# Patient Record
Sex: Male | Born: 1950
Health system: Southern US, Community
[De-identification: ages and names within clinical notes are randomized; demographics above are authoritative.]

---

## 2006-02-08 ENCOUNTER — Ambulatory Visit: Payer: Self-pay | Admitting: Cardiology

## 2006-04-26 ENCOUNTER — Encounter: Payer: Self-pay | Admitting: Cardiology

## 2006-05-05 ENCOUNTER — Encounter: Payer: Self-pay | Admitting: Cardiology

## 2006-06-03 ENCOUNTER — Encounter: Payer: Self-pay | Admitting: Cardiology

## 2006-07-04 ENCOUNTER — Encounter: Payer: Self-pay | Admitting: Cardiology

## 2006-08-03 ENCOUNTER — Encounter: Payer: Self-pay | Admitting: Cardiology

## 2006-11-01 ENCOUNTER — Inpatient Hospital Stay: Payer: Self-pay | Admitting: Internal Medicine

## 2007-01-24 ENCOUNTER — Ambulatory Visit: Payer: Self-pay | Admitting: Gastroenterology

## 2012-07-30 ENCOUNTER — Ambulatory Visit: Payer: Self-pay | Admitting: Unknown Physician Specialty

## 2012-08-21 ENCOUNTER — Ambulatory Visit: Payer: Self-pay | Admitting: Internal Medicine

## 2013-05-23 ENCOUNTER — Ambulatory Visit: Payer: Self-pay | Admitting: Specialist

## 2013-05-29 ENCOUNTER — Ambulatory Visit: Payer: Self-pay | Admitting: Specialist

## 2013-06-12 ENCOUNTER — Ambulatory Visit: Payer: Self-pay | Admitting: Specialist

## 2013-06-17 ENCOUNTER — Ambulatory Visit: Payer: Self-pay | Admitting: Cardiothoracic Surgery

## 2013-06-20 ENCOUNTER — Ambulatory Visit: Payer: Self-pay | Admitting: Cardiothoracic Surgery

## 2013-06-20 LAB — BASIC METABOLIC PANEL
Anion Gap: 2 — ABNORMAL LOW (ref 7–16)
BUN: 20 mg/dL — ABNORMAL HIGH (ref 7–18)
CHLORIDE: 103 mmol/L (ref 98–107)
CREATININE: 1.36 mg/dL — AB (ref 0.60–1.30)
Calcium, Total: 9.3 mg/dL (ref 8.5–10.1)
Co2: 33 mmol/L — ABNORMAL HIGH (ref 21–32)
EGFR (African American): >60
GFR CALC NON AF AMER: 55 — AB
Glucose: 63 mg/dL — ABNORMAL LOW (ref 65–99)
OSMOLALITY: 276 (ref 275–301)
POTASSIUM: 4 mmol/L (ref 3.5–5.1)
SODIUM: 138 mmol/L (ref 136–145)

## 2013-06-20 LAB — CBC WITH DIFFERENTIAL/PLATELET
Basophil #: 0 10*3/uL (ref 0.0–0.1)
Basophil %: 0.7 %
Eosinophil #: 0.2 10*3/uL (ref 0.0–0.7)
Eosinophil %: 3.4 %
HCT: 44 % (ref 40.0–52.0)
HGB: 14.3 g/dL (ref 13.0–18.0)
LYMPHS ABS: 1.1 10*3/uL (ref 1.0–3.6)
LYMPHS PCT: 19.1 %
MCH: 30.9 pg (ref 26.0–34.0)
MCHC: 32.4 g/dL (ref 32.0–36.0)
MCV: 96 fL (ref 80–100)
Monocyte #: 0.8 x10 3/mm (ref 0.2–1.0)
Monocyte %: 13.7 %
Neutrophil #: 3.7 10*3/uL (ref 1.4–6.5)
Neutrophil %: 63.1 %
PLATELETS: 152 10*3/uL (ref 150–440)
RBC: 4.61 10*6/uL (ref 4.40–5.90)
RDW: 16.9 % — ABNORMAL HIGH (ref 11.5–14.5)
WBC: 5.9 10*3/uL (ref 3.8–10.6)

## 2013-06-29 ENCOUNTER — Inpatient Hospital Stay: Payer: Self-pay | Admitting: Internal Medicine

## 2013-06-29 LAB — CBC
HCT: 48 % (ref 40.0–52.0)
HGB: 15.2 g/dL (ref 13.0–18.0)
MCH: 30.3 pg (ref 26.0–34.0)
MCHC: 31.7 g/dL — ABNORMAL LOW (ref 32.0–36.0)
MCV: 95 fL (ref 80–100)
Platelet: 248 10*3/uL (ref 150–440)
RBC: 5.03 10*6/uL (ref 4.40–5.90)
RDW: 17.3 % — AB (ref 11.5–14.5)
WBC: 11.7 10*3/uL — ABNORMAL HIGH (ref 3.8–10.6)

## 2013-06-29 LAB — TROPONIN I: Troponin-I: >40 ng/mL

## 2013-06-29 LAB — COMPREHENSIVE METABOLIC PANEL
ALBUMIN: 2.9 g/dL — AB (ref 3.4–5.0)
ALT: 849 U/L — AB (ref 12–78)
Alkaline Phosphatase: 173 U/L — ABNORMAL HIGH
Anion Gap: 7 (ref 7–16)
BUN: 46 mg/dL — ABNORMAL HIGH (ref 7–18)
Bilirubin,Total: 1.7 mg/dL — ABNORMAL HIGH (ref 0.2–1.0)
CHLORIDE: 97 mmol/L — AB (ref 98–107)
CO2: 26 mmol/L (ref 21–32)
Calcium, Total: 8.6 mg/dL (ref 8.5–10.1)
Creatinine: 2.79 mg/dL — ABNORMAL HIGH (ref 0.60–1.30)
EGFR (Non-African Amer.): 23 — ABNORMAL LOW
GFR CALC AF AMER: 27 — AB
Glucose: 131 mg/dL — ABNORMAL HIGH (ref 65–99)
Osmolality: 275 (ref 275–301)
POTASSIUM: 4.5 mmol/L (ref 3.5–5.1)
SODIUM: 130 mmol/L — AB (ref 136–145)
Total Protein: 6 g/dL — ABNORMAL LOW (ref 6.4–8.2)

## 2013-06-29 LAB — PROTIME-INR
INR: 2
Prothrombin Time: 22.4 secs — ABNORMAL HIGH (ref 11.5–14.7)

## 2013-06-29 LAB — APTT: ACTIVATED PTT: 31.1 s (ref 23.6–35.9)

## 2013-06-29 LAB — MAGNESIUM: Magnesium: 2.1 mg/dL

## 2013-06-29 LAB — PRO B NATRIURETIC PEPTIDE: B-Type Natriuretic Peptide: 5890 pg/mL — ABNORMAL HIGH (ref 0–125)

## 2013-06-29 LAB — LIPASE, BLOOD: Lipase: 269 U/L (ref 73–393)

## 2013-06-30 LAB — URINALYSIS, COMPLETE
BACTERIA: NONE SEEN
Bilirubin,UR: NEGATIVE
Blood: NEGATIVE
GLUCOSE, UR: NEGATIVE mg/dL (ref 0–75)
Granular Cast: 33
Ketone: NEGATIVE
Leukocyte Esterase: NEGATIVE
NITRITE: NEGATIVE
Ph: 5 (ref 4.5–8.0)
Protein: 100
SPECIFIC GRAVITY: 1.017 (ref 1.003–1.030)
Squamous Epithelial: 2

## 2013-06-30 LAB — APTT
Activated PTT: >160 secs (ref 23.6–35.9)
Activated PTT: 62 secs — ABNORMAL HIGH (ref 23.6–35.9)
Activated PTT: 141.7 secs — ABNORMAL HIGH (ref 23.6–35.9)

## 2013-06-30 LAB — TROPONIN I

## 2013-07-01 LAB — COMPREHENSIVE METABOLIC PANEL
ALK PHOS: 143 U/L — AB
Albumin: 2.8 g/dL — ABNORMAL LOW (ref 3.4–5.0)
Anion Gap: 9 (ref 7–16)
BILIRUBIN TOTAL: 1.8 mg/dL — AB (ref 0.2–1.0)
BUN: 57 mg/dL — ABNORMAL HIGH (ref 7–18)
CHLORIDE: 90 mmol/L — AB (ref 98–107)
CO2: 27 mmol/L (ref 21–32)
Calcium, Total: 8.4 mg/dL — ABNORMAL LOW (ref 8.5–10.1)
Creatinine: 2.73 mg/dL — ABNORMAL HIGH (ref 0.60–1.30)
EGFR (African American): 27 — ABNORMAL LOW
GFR CALC NON AF AMER: 24 — AB
Glucose: 285 mg/dL — ABNORMAL HIGH (ref 65–99)
Osmolality: 280 (ref 275–301)
Potassium: 4.7 mmol/L (ref 3.5–5.1)
SGOT(AST): 1376 U/L — ABNORMAL HIGH (ref 15–37)
SGPT (ALT): 942 U/L — ABNORMAL HIGH (ref 12–78)
Sodium: 126 mmol/L — ABNORMAL LOW (ref 136–145)
Total Protein: 6.1 g/dL — ABNORMAL LOW (ref 6.4–8.2)

## 2013-07-01 LAB — CBC WITH DIFFERENTIAL/PLATELET
BASOS PCT: 0.3 %
Basophil #: 0 10*3/uL (ref 0.0–0.1)
EOS ABS: 0 10*3/uL (ref 0.0–0.7)
Eosinophil %: 0.2 %
HCT: 43.7 % (ref 40.0–52.0)
HGB: 14.3 g/dL (ref 13.0–18.0)
LYMPHS ABS: 0.8 10*3/uL — AB (ref 1.0–3.6)
Lymphocyte %: 7.1 %
MCH: 30.7 pg (ref 26.0–34.0)
MCHC: 32.8 g/dL (ref 32.0–36.0)
MCV: 94 fL (ref 80–100)
MONOS PCT: 11 %
Monocyte #: 1.2 x10 3/mm — ABNORMAL HIGH (ref 0.2–1.0)
NEUTROS ABS: 8.6 10*3/uL — AB (ref 1.4–6.5)
Neutrophil %: 81.4 %
PLATELETS: 191 10*3/uL (ref 150–440)
RBC: 4.67 10*6/uL (ref 4.40–5.90)
RDW: 17 % — ABNORMAL HIGH (ref 11.5–14.5)
WBC: 10.6 10*3/uL (ref 3.8–10.6)

## 2013-07-01 LAB — APTT: Activated PTT: 28.6 secs (ref 23.6–35.9)

## 2013-07-01 LAB — PROTIME-INR
INR: 1.7
Prothrombin Time: 19.6 secs — ABNORMAL HIGH (ref 11.5–14.7)

## 2013-07-01 LAB — URINE CULTURE

## 2013-07-02 DIAGNOSIS — I499 Cardiac arrhythmia, unspecified: Secondary | ICD-10-CM

## 2013-07-02 LAB — PROTEIN ELECTROPHORESIS(ARMC)

## 2013-07-02 LAB — COMPREHENSIVE METABOLIC PANEL
ALT: 787 U/L — AB (ref 12–78)
ANION GAP: 5 — AB (ref 7–16)
AST: 782 U/L — AB (ref 15–37)
Albumin: 2.7 g/dL — ABNORMAL LOW (ref 3.4–5.0)
Alkaline Phosphatase: 132 U/L — ABNORMAL HIGH
BUN: 49 mg/dL — ABNORMAL HIGH (ref 7–18)
Bilirubin,Total: 2 mg/dL — ABNORMAL HIGH (ref 0.2–1.0)
Calcium, Total: 8.4 mg/dL — ABNORMAL LOW (ref 8.5–10.1)
Chloride: 91 mmol/L — ABNORMAL LOW (ref 98–107)
Co2: 31 mmol/L (ref 21–32)
Creatinine: 1.8 mg/dL — ABNORMAL HIGH (ref 0.60–1.30)
EGFR (African American): 45 — ABNORMAL LOW
GFR CALC NON AF AMER: 39 — AB
Glucose: 162 mg/dL — ABNORMAL HIGH (ref 65–99)
Osmolality: 272 (ref 275–301)
POTASSIUM: 4.6 mmol/L (ref 3.5–5.1)
Sodium: 127 mmol/L — ABNORMAL LOW (ref 136–145)
Total Protein: 5.9 g/dL — ABNORMAL LOW (ref 6.4–8.2)

## 2013-07-02 LAB — CBC WITH DIFFERENTIAL/PLATELET
BASOS PCT: 0.4 %
Basophil #: 0 10*3/uL (ref 0.0–0.1)
EOS PCT: 2.4 %
Eosinophil #: 0.2 10*3/uL (ref 0.0–0.7)
HCT: 42.6 % (ref 40.0–52.0)
HGB: 13.9 g/dL (ref 13.0–18.0)
Lymphocyte #: 0.9 10*3/uL — ABNORMAL LOW (ref 1.0–3.6)
Lymphocyte %: 9.6 %
MCH: 30.7 pg (ref 26.0–34.0)
MCHC: 32.7 g/dL (ref 32.0–36.0)
MCV: 94 fL (ref 80–100)
MONOS PCT: 11.3 %
Monocyte #: 1 x10 3/mm (ref 0.2–1.0)
Neutrophil #: 7 10*3/uL — ABNORMAL HIGH (ref 1.4–6.5)
Neutrophil %: 76.3 %
Platelet: 134 10*3/uL — ABNORMAL LOW (ref 150–440)
RBC: 4.53 10*6/uL (ref 4.40–5.90)
RDW: 17.1 % — AB (ref 11.5–14.5)
WBC: 9.1 10*3/uL (ref 3.8–10.6)

## 2013-07-02 LAB — PROTEIN / CREATININE RATIO, URINE: CREATININE, URINE: 50.9 mg/dL (ref 30.0–125.0)

## 2013-07-02 LAB — MAGNESIUM: MAGNESIUM: 1.9 mg/dL

## 2013-07-03 LAB — PROTIME-INR
INR: 1.2
Prothrombin Time: 15 secs — ABNORMAL HIGH (ref 11.5–14.7)

## 2013-07-03 LAB — COMPREHENSIVE METABOLIC PANEL
ALBUMIN: 2.8 g/dL — AB (ref 3.4–5.0)
ANION GAP: 2 — AB (ref 7–16)
Alkaline Phosphatase: 135 U/L — ABNORMAL HIGH
BILIRUBIN TOTAL: 2.1 mg/dL — AB (ref 0.2–1.0)
BUN: 40 mg/dL — ABNORMAL HIGH (ref 7–18)
CALCIUM: 8.4 mg/dL — AB (ref 8.5–10.1)
CHLORIDE: 92 mmol/L — AB (ref 98–107)
Co2: 32 mmol/L (ref 21–32)
Creatinine: 1.36 mg/dL — ABNORMAL HIGH (ref 0.60–1.30)
GFR CALC NON AF AMER: 55 — AB
GLUCOSE: 66 mg/dL (ref 65–99)
OSMOLALITY: 261 (ref 275–301)
Potassium: 4.6 mmol/L (ref 3.5–5.1)
SGOT(AST): 760 U/L — ABNORMAL HIGH (ref 15–37)
SGPT (ALT): 795 U/L — ABNORMAL HIGH (ref 12–78)
SODIUM: 126 mmol/L — AB (ref 136–145)
Total Protein: 6.1 g/dL — ABNORMAL LOW (ref 6.4–8.2)

## 2013-07-03 LAB — CBC WITH DIFFERENTIAL/PLATELET
Basophil #: 0.1 10*3/uL (ref 0.0–0.1)
Basophil %: 0.6 %
EOS ABS: 0.5 10*3/uL (ref 0.0–0.7)
Eosinophil %: 5.7 %
HCT: 43.4 % (ref 40.0–52.0)
HGB: 13.9 g/dL (ref 13.0–18.0)
LYMPHS ABS: 1.2 10*3/uL (ref 1.0–3.6)
Lymphocyte %: 13.6 %
MCH: 30.1 pg (ref 26.0–34.0)
MCHC: 32 g/dL (ref 32.0–36.0)
MCV: 94 fL (ref 80–100)
MONOS PCT: 15.9 %
Monocyte #: 1.4 x10 3/mm — ABNORMAL HIGH (ref 0.2–1.0)
NEUTROS ABS: 5.7 10*3/uL (ref 1.4–6.5)
NEUTROS PCT: 64.2 %
Platelet: 150 10*3/uL (ref 150–440)
RBC: 4.62 10*6/uL (ref 4.40–5.90)
RDW: 17.8 % — ABNORMAL HIGH (ref 11.5–14.5)
WBC: 8.9 10*3/uL (ref 3.8–10.6)

## 2013-07-04 LAB — CBC WITH DIFFERENTIAL/PLATELET
BASOS ABS: 0.1 10*3/uL (ref 0.0–0.1)
Basophil %: 0.6 %
EOS ABS: 0.5 10*3/uL (ref 0.0–0.7)
EOS PCT: 5 %
HCT: 47.6 % (ref 40.0–52.0)
HGB: 15.4 g/dL (ref 13.0–18.0)
LYMPHS ABS: 1.2 10*3/uL (ref 1.0–3.6)
Lymphocyte %: 13.1 %
MCH: 30.5 pg (ref 26.0–34.0)
MCHC: 32.4 g/dL (ref 32.0–36.0)
MCV: 94 fL (ref 80–100)
Monocyte #: 1.5 x10 3/mm — ABNORMAL HIGH (ref 0.2–1.0)
Monocyte %: 15.6 %
NEUTROS ABS: 6.2 10*3/uL (ref 1.4–6.5)
Neutrophil %: 65.7 %
Platelet: 181 10*3/uL (ref 150–440)
RBC: 5.07 10*6/uL (ref 4.40–5.90)
RDW: 17.8 % — ABNORMAL HIGH (ref 11.5–14.5)
WBC: 9.4 10*3/uL (ref 3.8–10.6)

## 2013-07-04 LAB — COMPREHENSIVE METABOLIC PANEL
ALK PHOS: 169 U/L — AB
ALT: 629 U/L — AB (ref 12–78)
AST: 423 U/L — AB (ref 15–37)
Albumin: 2.9 g/dL — ABNORMAL LOW (ref 3.4–5.0)
Anion Gap: 2 — ABNORMAL LOW (ref 7–16)
BUN: 34 mg/dL — ABNORMAL HIGH (ref 7–18)
Bilirubin,Total: 2.1 mg/dL — ABNORMAL HIGH (ref 0.2–1.0)
CREATININE: 1.12 mg/dL (ref 0.60–1.30)
Calcium, Total: 8.7 mg/dL (ref 8.5–10.1)
Chloride: 95 mmol/L — ABNORMAL LOW (ref 98–107)
Co2: 33 mmol/L — ABNORMAL HIGH (ref 21–32)
EGFR (Non-African Amer.): >60
Glucose: 43 mg/dL — ABNORMAL LOW (ref 65–99)
Osmolality: 265 (ref 275–301)
Potassium: 4.8 mmol/L (ref 3.5–5.1)
Sodium: 130 mmol/L — ABNORMAL LOW (ref 136–145)
Total Protein: 6.8 g/dL (ref 6.4–8.2)

## 2013-07-04 LAB — PROTIME-INR
INR: 1.1
PROTHROMBIN TIME: 14 s (ref 11.5–14.7)

## 2013-07-04 LAB — UR PROT ELECTROPHORESIS, URINE RANDOM

## 2013-07-05 LAB — PROTIME-INR
INR: 1.2
Prothrombin Time: 14.7 secs (ref 11.5–14.7)

## 2013-07-05 LAB — COMPREHENSIVE METABOLIC PANEL
ALBUMIN: 2.8 g/dL — AB (ref 3.4–5.0)
Alkaline Phosphatase: 164 U/L — ABNORMAL HIGH
Anion Gap: 3 — ABNORMAL LOW (ref 7–16)
BUN: 32 mg/dL — ABNORMAL HIGH (ref 7–18)
Bilirubin,Total: 1.8 mg/dL — ABNORMAL HIGH (ref 0.2–1.0)
CREATININE: 1.19 mg/dL (ref 0.60–1.30)
Calcium, Total: 9.1 mg/dL (ref 8.5–10.1)
Chloride: 92 mmol/L — ABNORMAL LOW (ref 98–107)
Co2: 32 mmol/L (ref 21–32)
GLUCOSE: 43 mg/dL — AB (ref 65–99)
OSMOLALITY: 259 (ref 275–301)
POTASSIUM: 4.8 mmol/L (ref 3.5–5.1)
SGOT(AST): 182 U/L — ABNORMAL HIGH (ref 15–37)
SGPT (ALT): 427 U/L — ABNORMAL HIGH (ref 12–78)
Sodium: 127 mmol/L — ABNORMAL LOW (ref 136–145)
TOTAL PROTEIN: 6.7 g/dL (ref 6.4–8.2)

## 2013-07-06 LAB — COMPREHENSIVE METABOLIC PANEL
ALBUMIN: 2.4 g/dL — AB (ref 3.4–5.0)
ALT: 294 U/L — AB (ref 12–78)
AST: 99 U/L — AB (ref 15–37)
Alkaline Phosphatase: 138 U/L — ABNORMAL HIGH
Anion Gap: 3 — ABNORMAL LOW (ref 7–16)
BILIRUBIN TOTAL: 1.3 mg/dL — AB (ref 0.2–1.0)
BUN: 30 mg/dL — AB (ref 7–18)
CALCIUM: 8.4 mg/dL — AB (ref 8.5–10.1)
CHLORIDE: 92 mmol/L — AB (ref 98–107)
CO2: 33 mmol/L — AB (ref 21–32)
Creatinine: 1.34 mg/dL — ABNORMAL HIGH (ref 0.60–1.30)
EGFR (Non-African Amer.): 56 — ABNORMAL LOW
Glucose: 229 mg/dL — ABNORMAL HIGH (ref 65–99)
Osmolality: 271 (ref 275–301)
POTASSIUM: 4.8 mmol/L (ref 3.5–5.1)
Sodium: 128 mmol/L — ABNORMAL LOW (ref 136–145)
TOTAL PROTEIN: 6 g/dL — AB (ref 6.4–8.2)

## 2013-07-07 LAB — HEPATIC FUNCTION PANEL A (ARMC)
ALBUMIN: 2.3 g/dL — AB (ref 3.4–5.0)
ALT: 205 U/L — AB (ref 12–78)
Alkaline Phosphatase: 127 U/L — ABNORMAL HIGH
Bilirubin, Direct: 0.8 mg/dL — ABNORMAL HIGH (ref 0.00–0.20)
Bilirubin,Total: 1.1 mg/dL — ABNORMAL HIGH (ref 0.2–1.0)
SGOT(AST): 58 U/L — ABNORMAL HIGH (ref 15–37)
Total Protein: 5.6 g/dL — ABNORMAL LOW (ref 6.4–8.2)

## 2013-07-07 LAB — BASIC METABOLIC PANEL
Anion Gap: 6 — ABNORMAL LOW (ref 7–16)
BUN: 28 mg/dL — AB (ref 7–18)
Calcium, Total: 7.8 mg/dL — ABNORMAL LOW (ref 8.5–10.1)
Chloride: 100 mmol/L (ref 98–107)
Co2: 30 mmol/L (ref 21–32)
Creatinine: 1.29 mg/dL (ref 0.60–1.30)
EGFR (African American): >60
EGFR (Non-African Amer.): 59 — ABNORMAL LOW
GLUCOSE: 212 mg/dL — AB (ref 65–99)
Osmolality: 284 (ref 275–301)
Potassium: 4.1 mmol/L (ref 3.5–5.1)
Sodium: 136 mmol/L (ref 136–145)

## 2013-07-08 ENCOUNTER — Encounter: Payer: Self-pay | Admitting: Internal Medicine

## 2013-07-08 LAB — CBC WITH DIFFERENTIAL/PLATELET
BASOS ABS: 0 10*3/uL (ref 0.0–0.1)
BASOS PCT: 0.5 %
EOS ABS: 0.4 10*3/uL (ref 0.0–0.7)
EOS PCT: 4.8 %
HCT: 43.3 % (ref 40.0–52.0)
HGB: 14.2 g/dL (ref 13.0–18.0)
Lymphocyte #: 1.2 10*3/uL (ref 1.0–3.6)
Lymphocyte %: 13 %
MCH: 30.5 pg (ref 26.0–34.0)
MCHC: 32.8 g/dL (ref 32.0–36.0)
MCV: 93 fL (ref 80–100)
Monocyte #: 1.2 x10 3/mm — ABNORMAL HIGH (ref 0.2–1.0)
Monocyte %: 12.7 %
NEUTROS ABS: 6.3 10*3/uL (ref 1.4–6.5)
NEUTROS PCT: 69 %
Platelet: 199 10*3/uL (ref 150–440)
RBC: 4.65 10*6/uL (ref 4.40–5.90)
RDW: 17.4 % — ABNORMAL HIGH (ref 11.5–14.5)
WBC: 9.2 10*3/uL (ref 3.8–10.6)

## 2013-07-08 LAB — RENAL FUNCTION PANEL
Albumin: 2.6 g/dL — ABNORMAL LOW (ref 3.4–5.0)
Anion Gap: 3 — ABNORMAL LOW (ref 7–16)
BUN: 26 mg/dL — ABNORMAL HIGH (ref 7–18)
CALCIUM: 8.7 mg/dL (ref 8.5–10.1)
CHLORIDE: 96 mmol/L — AB (ref 98–107)
CREATININE: 1.33 mg/dL — AB (ref 0.60–1.30)
Co2: 33 mmol/L — ABNORMAL HIGH (ref 21–32)
EGFR (African American): >60
EGFR (Non-African Amer.): 56 — ABNORMAL LOW
GLUCOSE: 119 mg/dL — AB (ref 65–99)
Osmolality: 270 (ref 275–301)
PHOSPHORUS: 3.5 mg/dL (ref 2.5–4.9)
Potassium: 4.5 mmol/L (ref 3.5–5.1)
SODIUM: 132 mmol/L — AB (ref 136–145)

## 2013-07-08 LAB — HEPATIC FUNCTION PANEL A (ARMC)
ALT: 181 U/L — AB (ref 12–78)
Alkaline Phosphatase: 133 U/L — ABNORMAL HIGH
Bilirubin, Direct: 0.9 mg/dL — ABNORMAL HIGH (ref 0.00–0.20)
Bilirubin,Total: 1.4 mg/dL — ABNORMAL HIGH (ref 0.2–1.0)
SGOT(AST): 54 U/L — ABNORMAL HIGH (ref 15–37)
Total Protein: 6.3 g/dL — ABNORMAL LOW (ref 6.4–8.2)

## 2013-07-09 LAB — HEMOGLOBIN A1C: Hemoglobin A1C: 7.9 % — ABNORMAL HIGH (ref 4.2–6.3)

## 2013-07-11 LAB — BASIC METABOLIC PANEL
Anion Gap: 3 — ABNORMAL LOW (ref 7–16)
BUN: 33 mg/dL — AB (ref 7–18)
CREATININE: 1.38 mg/dL — AB (ref 0.60–1.30)
Calcium, Total: 9.2 mg/dL (ref 8.5–10.1)
Chloride: 91 mmol/L — ABNORMAL LOW (ref 98–107)
Co2: 36 mmol/L — ABNORMAL HIGH (ref 21–32)
EGFR (Non-African Amer.): 54 — ABNORMAL LOW
GLUCOSE: 78 mg/dL (ref 65–99)
Osmolality: 267 (ref 275–301)
POTASSIUM: 3.8 mmol/L (ref 3.5–5.1)
Sodium: 130 mmol/L — ABNORMAL LOW (ref 136–145)

## 2013-07-16 LAB — BASIC METABOLIC PANEL
Anion Gap: 7 (ref 7–16)
BUN: 43 mg/dL — ABNORMAL HIGH (ref 7–18)
CALCIUM: 8.6 mg/dL (ref 8.5–10.1)
Chloride: 87 mmol/L — ABNORMAL LOW (ref 98–107)
Co2: 37 mmol/L — ABNORMAL HIGH (ref 21–32)
Creatinine: 1.33 mg/dL — ABNORMAL HIGH (ref 0.60–1.30)
EGFR (African American): >60
EGFR (Non-African Amer.): 56 — ABNORMAL LOW
Glucose: 80 mg/dL (ref 65–99)
Osmolality: 272 (ref 275–301)
Potassium: 3.9 mmol/L (ref 3.5–5.1)
SODIUM: 131 mmol/L — AB (ref 136–145)

## 2013-07-16 LAB — CBC WITH DIFFERENTIAL/PLATELET
Basophil #: 0 10*3/uL (ref 0.0–0.1)
Basophil %: 0.2 %
Eosinophil #: 0.2 10*3/uL (ref 0.0–0.7)
Eosinophil %: 2.3 %
HCT: 44.6 % (ref 40.0–52.0)
HGB: 14 g/dL (ref 13.0–18.0)
LYMPHS ABS: 1 10*3/uL (ref 1.0–3.6)
Lymphocyte %: 13.7 %
MCH: 29.4 pg (ref 26.0–34.0)
MCHC: 31.3 g/dL — AB (ref 32.0–36.0)
MCV: 94 fL (ref 80–100)
Monocyte #: 0.6 x10 3/mm (ref 0.2–1.0)
Monocyte %: 7.7 %
Neutrophil #: 5.5 10*3/uL (ref 1.4–6.5)
Neutrophil %: 76.1 %
Platelet: 170 10*3/uL (ref 150–440)
RBC: 4.76 10*6/uL (ref 4.40–5.90)
RDW: 18.1 % — ABNORMAL HIGH (ref 11.5–14.5)
WBC: 7.2 10*3/uL (ref 3.8–10.6)

## 2013-07-16 LAB — PROTIME-INR
INR: 1.2
Prothrombin Time: 14.8 secs — ABNORMAL HIGH (ref 11.5–14.7)

## 2013-07-16 LAB — APTT: ACTIVATED PTT: 36.5 s — AB (ref 23.6–35.9)

## 2013-07-26 ENCOUNTER — Ambulatory Visit: Payer: Self-pay | Admitting: Gerontology

## 2013-07-26 ENCOUNTER — Inpatient Hospital Stay: Payer: Self-pay | Admitting: Internal Medicine

## 2013-07-26 DIAGNOSIS — I4949 Other premature depolarization: Secondary | ICD-10-CM

## 2013-07-26 DIAGNOSIS — I4891 Unspecified atrial fibrillation: Secondary | ICD-10-CM

## 2013-07-26 LAB — COMPREHENSIVE METABOLIC PANEL
ALBUMIN: 3 g/dL — AB (ref 3.4–5.0)
ALT: 37 U/L (ref 12–78)
Alkaline Phosphatase: 180 U/L — ABNORMAL HIGH
Anion Gap: 5 — ABNORMAL LOW (ref 7–16)
BUN: 48 mg/dL — ABNORMAL HIGH (ref 7–18)
Bilirubin,Total: 1.2 mg/dL — ABNORMAL HIGH (ref 0.2–1.0)
CREATININE: 1.6 mg/dL — AB (ref 0.60–1.30)
Calcium, Total: 9.3 mg/dL (ref 8.5–10.1)
Chloride: 88 mmol/L — ABNORMAL LOW (ref 98–107)
Co2: 40 mmol/L (ref 21–32)
EGFR (Non-African Amer.): 45 — ABNORMAL LOW
GFR CALC AF AMER: 52 — AB
Glucose: 118 mg/dL — ABNORMAL HIGH (ref 65–99)
Osmolality: 280 (ref 275–301)
POTASSIUM: 3.3 mmol/L — AB (ref 3.5–5.1)
SGOT(AST): 34 U/L (ref 15–37)
Sodium: 133 mmol/L — ABNORMAL LOW (ref 136–145)
TOTAL PROTEIN: 6.6 g/dL (ref 6.4–8.2)

## 2013-07-26 LAB — URINALYSIS, COMPLETE
BACTERIA: NONE SEEN
Bilirubin,UR: NEGATIVE
Blood: NEGATIVE
Glucose,UR: NEGATIVE mg/dL (ref 0–75)
KETONE: NEGATIVE
Nitrite: NEGATIVE
Ph: 5 (ref 4.5–8.0)
Protein: NEGATIVE
RBC,UR: 1 /HPF (ref 0–5)
SPECIFIC GRAVITY: 1.025 (ref 1.003–1.030)
Squamous Epithelial: <1

## 2013-07-26 LAB — CBC
HCT: 40.9 % (ref 40.0–52.0)
HGB: 13.4 g/dL (ref 13.0–18.0)
MCH: 29.7 pg (ref 26.0–34.0)
MCHC: 32.7 g/dL (ref 32.0–36.0)
MCV: 91 fL (ref 80–100)
Platelet: 176 10*3/uL (ref 150–440)
RBC: 4.51 10*6/uL (ref 4.40–5.90)
RDW: 18 % — AB (ref 11.5–14.5)
WBC: 7.3 10*3/uL (ref 3.8–10.6)

## 2013-07-26 LAB — PROTIME-INR
INR: 1.4
PROTHROMBIN TIME: 16.5 s — AB (ref 11.5–14.7)

## 2013-07-27 LAB — BASIC METABOLIC PANEL
ANION GAP: 5 — AB (ref 7–16)
BUN: 46 mg/dL — AB (ref 7–18)
CALCIUM: 9.2 mg/dL (ref 8.5–10.1)
CREATININE: 1.48 mg/dL — AB (ref 0.60–1.30)
Chloride: 88 mmol/L — ABNORMAL LOW (ref 98–107)
Co2: 39 mmol/L — ABNORMAL HIGH (ref 21–32)
EGFR (African American): 58 — ABNORMAL LOW
EGFR (Non-African Amer.): 50 — ABNORMAL LOW
Glucose: 66 mg/dL (ref 65–99)
Osmolality: 275 (ref 275–301)
POTASSIUM: 3.1 mmol/L — AB (ref 3.5–5.1)
Sodium: 132 mmol/L — ABNORMAL LOW (ref 136–145)

## 2013-07-27 LAB — CBC WITH DIFFERENTIAL/PLATELET
BASOS PCT: 0.6 %
Basophil #: 0 10*3/uL (ref 0.0–0.1)
EOS PCT: 1.7 %
Eosinophil #: 0.1 10*3/uL (ref 0.0–0.7)
HCT: 42.8 % (ref 40.0–52.0)
HGB: 13.9 g/dL (ref 13.0–18.0)
Lymphocyte #: 0.9 10*3/uL — ABNORMAL LOW (ref 1.0–3.6)
Lymphocyte %: 12.3 %
MCH: 29.7 pg (ref 26.0–34.0)
MCHC: 32.4 g/dL (ref 32.0–36.0)
MCV: 92 fL (ref 80–100)
Monocyte #: 0.9 x10 3/mm (ref 0.2–1.0)
Monocyte %: 12.1 %
Neutrophil #: 5.3 10*3/uL (ref 1.4–6.5)
Neutrophil %: 73.3 %
Platelet: 164 10*3/uL (ref 150–440)
RBC: 4.66 10*6/uL (ref 4.40–5.90)
RDW: 17.9 % — ABNORMAL HIGH (ref 11.5–14.5)
WBC: 7.2 10*3/uL (ref 3.8–10.6)

## 2013-07-27 LAB — HEPATIC FUNCTION PANEL A (ARMC)
ALBUMIN: 3.1 g/dL — AB (ref 3.4–5.0)
AST: 38 U/L — AB (ref 15–37)
Alkaline Phosphatase: 173 U/L — ABNORMAL HIGH
Bilirubin, Direct: 0.6 mg/dL — ABNORMAL HIGH (ref 0.00–0.20)
Bilirubin,Total: 1.2 mg/dL — ABNORMAL HIGH (ref 0.2–1.0)
SGPT (ALT): 37 U/L (ref 12–78)
TOTAL PROTEIN: 6.3 g/dL — AB (ref 6.4–8.2)

## 2013-07-27 LAB — MAGNESIUM: Magnesium: 2.1 mg/dL

## 2013-07-27 LAB — TSH: THYROID STIMULATING HORM: 2.67 u[IU]/mL

## 2013-07-28 LAB — BASIC METABOLIC PANEL
Anion Gap: 5 — ABNORMAL LOW (ref 7–16)
BUN: 46 mg/dL — AB (ref 7–18)
CREATININE: 1.43 mg/dL — AB (ref 0.60–1.30)
Calcium, Total: 9 mg/dL (ref 8.5–10.1)
Chloride: 89 mmol/L — ABNORMAL LOW (ref 98–107)
Co2: 36 mmol/L — ABNORMAL HIGH (ref 21–32)
EGFR (Non-African Amer.): 52 — ABNORMAL LOW
GFR CALC AF AMER: 60 — AB
GLUCOSE: 116 mg/dL — AB (ref 65–99)
OSMOLALITY: 274 (ref 275–301)
POTASSIUM: 3.9 mmol/L (ref 3.5–5.1)
SODIUM: 130 mmol/L — AB (ref 136–145)

## 2013-07-28 LAB — CBC WITH DIFFERENTIAL/PLATELET
Basophil #: 0.1 10*3/uL (ref 0.0–0.1)
Basophil %: 0.9 %
EOS ABS: 0.2 10*3/uL (ref 0.0–0.7)
Eosinophil %: 2.3 %
HCT: 43 % (ref 40.0–52.0)
HGB: 14 g/dL (ref 13.0–18.0)
LYMPHS ABS: 1.1 10*3/uL (ref 1.0–3.6)
LYMPHS PCT: 16.8 %
MCH: 29.9 pg (ref 26.0–34.0)
MCHC: 32.5 g/dL (ref 32.0–36.0)
MCV: 92 fL (ref 80–100)
MONOS PCT: 13.9 %
Monocyte #: 0.9 x10 3/mm (ref 0.2–1.0)
NEUTROS PCT: 66.1 %
Neutrophil #: 4.4 10*3/uL (ref 1.4–6.5)
PLATELETS: 165 10*3/uL (ref 150–440)
RBC: 4.68 10*6/uL (ref 4.40–5.90)
RDW: 18.4 % — AB (ref 11.5–14.5)
WBC: 6.6 10*3/uL (ref 3.8–10.6)

## 2013-07-29 LAB — BASIC METABOLIC PANEL
Anion Gap: 10 (ref 7–16)
BUN: 50 mg/dL — ABNORMAL HIGH (ref 7–18)
CHLORIDE: 89 mmol/L — AB (ref 98–107)
CREATININE: 1.62 mg/dL — AB (ref 0.60–1.30)
Calcium, Total: 9.5 mg/dL (ref 8.5–10.1)
Co2: 31 mmol/L (ref 21–32)
EGFR (African American): 52 — ABNORMAL LOW
EGFR (Non-African Amer.): 45 — ABNORMAL LOW
GLUCOSE: 136 mg/dL — AB (ref 65–99)
OSMOLALITY: 276 (ref 275–301)
Potassium: 4 mmol/L (ref 3.5–5.1)
Sodium: 130 mmol/L — ABNORMAL LOW (ref 136–145)

## 2013-07-30 LAB — BASIC METABOLIC PANEL
Anion Gap: 8 (ref 7–16)
BUN: 49 mg/dL — AB (ref 7–18)
CHLORIDE: 87 mmol/L — AB (ref 98–107)
CO2: 33 mmol/L — AB (ref 21–32)
Calcium, Total: 9.2 mg/dL (ref 8.5–10.1)
Creatinine: 1.52 mg/dL — ABNORMAL HIGH (ref 0.60–1.30)
EGFR (African American): 56 — ABNORMAL LOW
EGFR (Non-African Amer.): 48 — ABNORMAL LOW
GLUCOSE: 133 mg/dL — AB (ref 65–99)
OSMOLALITY: 272 (ref 275–301)
Potassium: 3.7 mmol/L (ref 3.5–5.1)
Sodium: 128 mmol/L — ABNORMAL LOW (ref 136–145)

## 2013-07-30 LAB — VANCOMYCIN, TROUGH: VANCOMYCIN, TROUGH: 16 ug/mL (ref 10–20)

## 2013-07-31 ENCOUNTER — Ambulatory Visit: Payer: Self-pay | Admitting: Internal Medicine

## 2013-07-31 LAB — BASIC METABOLIC PANEL
Anion Gap: 10 (ref 7–16)
BUN: 48 mg/dL — ABNORMAL HIGH (ref 7–18)
CALCIUM: 9.8 mg/dL (ref 8.5–10.1)
CO2: 32 mmol/L (ref 21–32)
CREATININE: 1.52 mg/dL — AB (ref 0.60–1.30)
Chloride: 88 mmol/L — ABNORMAL LOW (ref 98–107)
EGFR (Non-African Amer.): 48 — ABNORMAL LOW
GFR CALC AF AMER: 56 — AB
Glucose: 103 mg/dL — ABNORMAL HIGH (ref 65–99)
Osmolality: 274 (ref 275–301)
Potassium: 3.9 mmol/L (ref 3.5–5.1)
Sodium: 130 mmol/L — ABNORMAL LOW (ref 136–145)

## 2013-07-31 LAB — CULTURE, BLOOD (SINGLE)

## 2013-08-01 LAB — BASIC METABOLIC PANEL
Anion Gap: 8 (ref 7–16)
BUN: 47 mg/dL — ABNORMAL HIGH (ref 7–18)
CREATININE: 1.54 mg/dL — AB (ref 0.60–1.30)
Calcium, Total: 9.6 mg/dL (ref 8.5–10.1)
Chloride: 90 mmol/L — ABNORMAL LOW (ref 98–107)
Co2: 31 mmol/L (ref 21–32)
EGFR (African American): 55 — ABNORMAL LOW
EGFR (Non-African Amer.): 47 — ABNORMAL LOW
GLUCOSE: 151 mg/dL — AB (ref 65–99)
OSMOLALITY: 274 (ref 275–301)
Potassium: 3.8 mmol/L (ref 3.5–5.1)
Sodium: 129 mmol/L — ABNORMAL LOW (ref 136–145)

## 2013-08-01 LAB — EXPECTORATED SPUTUM ASSESSMENT W GRAM STAIN, RFLX TO RESP C

## 2013-08-01 LAB — HEMOGLOBIN: HGB: 14 g/dL (ref 13.0–18.0)

## 2014-07-26 NOTE — Consult Note (Signed)
   Comments   Dr Phifer and I met with pt and his son and daughter-in-law. Family intend to try to fly him to [place]. They are in agreement with hospice services and I have talked with a referral coordinator at [facility] [phone]). They will need paperwork faxed to ensure that he qualifies. Will have CM assist with this. again talked about code status. Patient had wanted to remain a full code to allow his [place] to [place]. However, with plan to move to [place], pt says he feels no need to attempt resuscitation. He agrees with DNR. Will complete portable form.     Electronic Signatures: Orbie Grupe, Kirt Boys (NP)  (Signed 30-Apr-15 12:47)  Authored: Palliative Care Phifer, Izora Gala (MD)  (Signed 30-Apr-15 13:30)  Authored: Palliative Care   Last Updated: 30-Apr-15 13:30 by Phifer, Izora Gala (MD)

## 2014-07-26 NOTE — Consult Note (Signed)
   Present Illness 64 yo male with history of cad s/p cabg admitted with progressive shortness of breath. Was noted to haave multisystem failure with markedly elevated liver transaminases, chronic kidney disease, and markedly elevated troponin at greather than 40. CXR reveals evidnece of pulmonary edema and previously noted left lug mass. He is relatively hypotensive requireing low dose dopamine. He states he has gradually noted worsening shorntess of breath, increasing weakness and fatigue. He denis chest pain.   Physical Exam:  GEN critically ill appearing   HEENT PERRL, hearing intact to voice   NECK supple   RESP normal resp effort  clear BS  no use of accessory muscles   CARD Regular rate and rhythm   ABD denies tenderness   LYMPH negative neck, negative axillae   EXTR negative cyanosis/clubbing   SKIN normal to palpation   NEURO cranial nerves intact, motor/sensory function intact   PSYCH A+O to time, place, person   Review of Systems:  Subjective/Chief Complaint shortness of breath   General: Fatigue  Weakness   Skin: No Complaints   ENT: No Complaints   Eyes: No Complaints   Neck: No Complaints   Respiratory: Short of breath   Cardiovascular: Dyspnea  Orthopnea  Edema   Gastrointestinal: No Complaints   Genitourinary: No Complaints   Vascular: No Complaints   Musculoskeletal: No Complaints   Neurologic: No Complaints   Hematologic: No Complaints   Endocrine: No Complaints   Psychiatric: No Complaints   Review of Systems: All other systems were reviewed and found to be negative   Medications/Allergies Reviewed Medications/Allergies reviewed   EKG:  EKG NSR    Tuberculin Skin Test: Rash  Statins: Other   Impression 64 yo with hisotyr of dilated cardiomyopathy with history of cad s/p cabg now admitted iwht progressive shorntess of breath, weakness and fatigue. Noted to have mjultisystem failure including markedly elevated iver transaminases  and elevated inr suggestive of liver failure. Also has acute on chornic renal insuffiency. He has systollic congestive heart failure wiht nyha class iv symptoms. His serum tropoonin is markedly elevated to greather than 40. Ekg does not show acute ishcmeia. Myocarditis vs ischemia is likely present. EF is 30-35% globally. Will need to support blood pressure with dopamine. Not a candidate for invasive cardiac evaluation at present given multisystem failure.   Plan 1. Dopamine at renal dose if possible 2. Will discontinue dobutamine for now 3. Careful with you of heparin given elevated inr. 4. Careful diuresis following renal funciton.  5. Will follow with you   Electronic Signatures: Dalia HeadingFath, Kenneth A (MD)  (Signed 29-Mar-15 16:40)  Authored: General Aspect/Present Illness, History and Physical Exam, Review of System, EKG , Allergies, Impression/Plan   Last Updated: 29-Mar-15 16:40 by Dalia HeadingFath, Kenneth A (MD)

## 2014-07-26 NOTE — Discharge Summary (Signed)
PATIENT NAME:  Timothy Calhoun, HAUETER MR#:  [id number] DATE OF BIRTH:  1950/11/12  DATE OF ADMISSION:  06/29/2013 DATE OF DISCHARGE: 07/08/2013   HISTORY OF PRESENT ILLNESS: Ms. Timothy Calhoun is a 64 year old white gentleman with a past history of coronary artery disease, type 2 diabetes and hypertension, who presented with increasing shortness of breath for 2 weeks. He had become so short of breath, he was able to walk less than 20 feet. He had been on chronic O2. He had been sleeping upright in a chair. He developed lower extremity edema. On arrival to the ER, he was noted to be sating in the 70s. He was placed on BiPAP. He was also noted to be hypotensive.   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   ALLERGIES: STATIN DRUGS AND TB SKIN TEST.   ADMISSION MEDICATIONS: Included aspirin 325 mg daily, Avapro 300 mg daily, diltiazem CD 240 mg daily, Lantus insulin 100 units at bedtime, metformin 1000 mg b.i.d., Victoza 18 mcg subcutaneously daily, Claritin 10 mg daily, Niaspan extended-release 1000 mg daily, TriCor 145 mg daily, Zetia 10 mg daily, metoprolol 50 mg b.i.d., Advair 100/50 inhaler 1 puff b.i.d., siriva 62.5 mcg inhalation daily, ProAir 2 puffs every 4 to 6 hours as needed, Lasix 40 mg b.i.d., potassium chloride 10 mEq b.i.d., omega-3 fish oil 1000 mg daily, multivitamin 1 tablet daily and vitamin B12 1000 mcg daily.   ADMISSION PHYSICAL EXAMINATION: Temperature 96, respirations 29, heart rate 72 and blood pressure 99/62. He was saturating 90% on BiPAP. Admission exam as described by the admitting physician, was notable for diminished breath sounds throughout. He was tachypnea, but was not using his accessory muscles. Cardiac exam reportedly showed regular rate and rhythm. There were no murmurs or gallops. He did have 3+ peripheral edema. The  remainder of the examination was unremarkable except for peripheral cyanosis noted in the upper extremities.   Admission CBC showed a hemoglobin of 15.2 with a hematocrit of 48. Platelet count was 248,000. White count was 11,700. Admission comprehensive metabolic panel showed a random blood sugar of 131, BUN 46, creatinine of 2.79, sodium of 130, chloride of 97, total bilirubin of 1.7, alkaline phosphatase of 173, SGPT 849 and an SGOT of greater than 2000. Protein of 6, an albumin of  2.9 and an estimated GFR of 23. BNP was 5890. Lipase was 269. Magnesium was 2.1. Troponin was greater than 40. EKG showed a sinus bradycardia with AV dissociation and runs of accelerated junctional rhythm. There was evidence of a possible lateral infarct. Admission chest x-ray showed cardiomegaly with changes of congestive heart failure. He previously noted a left upper lobe mass that was unchanged. Renal ultrasound showed no evidence of obstruction.   HOSPITAL COURSE: The patient was admitted to the ICU with a diagnosis of cardiogenic shock secondary to acute myocardial infarction. He was continued on supplemental oxygen and BiPAP. He was started on pressors. He was seen in consultation by cardiology and nephrology and the intensivist. His initial hospital course was complicated by marked arrhythmias. Due to his acute renal failure, his anticoagulants were discontinued. His niacin and fenofibrate were also held as was his Zetia. He was felt by the nephrologist to have cardiorenal syndrome with ATN. He eventually began to recover despite an echocardiogram showing an ejection fraction of less than 20%. He did not have any significant arrhythmias after the initial 48  hours in the hospital. He was eventually transferred to telemetry and evaluated by physical therapy where he was found to have limited stamina. His final CBC on the day of transfer showed a hemoglobin of 14.2 with a hematocrit of 43.3. White count was 9200. Platelet  count was 199,000. His final chemistry panel showed a random blood sugar of 119, BUN of 26, creatinine was 1.33, sodium 132, chloride 96, CO2 33. An estimated GFR of 56. Liver panel showed an alkaline phosphate 133. SGPT was 181. SGOT was 54. Total protein was 6.3. Total bilirubin was 1.4.   DISCHARGE DIAGNOSES:  1.  Cardiogenic shock secondary to acute myocardial infarction.  2.  Cardiorenal syndrome with acute tubular necrosis - resolving.  3.  Shock liver - resolving.  4.  History of hypertension.  5.  History of hyperlipidemia.  6.  Insulin-dependent type 2 diabetes.   DISCHARGE MEDICATIONS:  1.  Nitroglycerin 0.4 mg q.5 minutes p.r.n. chest pain.  2.  Tylenol 650 mg every 4 hours p.r.n. pain or temperature.  3.  Sliding scale of insulin.  4.  Niacin CR 100 mg at bedtime.  5.  Fenofibrate 145 mg daily.  6.  Zetia 10 mg daily.  7.  Advair Diskus 100/50 inhaler 1 puff b.i.d.  8.  Sodium chloride nasal spray 1 to 2 puffs both nostrils q.4 hours p.r.n. pain.  9.  Mucinex 600 mg b.i.d.  10. Coreg 3.125 mg b.i.d.  11. Aldactone 25 mg daily.  12. Protonix 40 mg daily.  13. Levemir 70 units subcutaneously at bedtime.  14. Lasix 40 mg b.i.d.  15. Potassium 10 mEq b.i.d.  16. Lovenox 155 mg subcu b.i.d.  17. BiPAP with a pressure of 8 and 12.   DISCHARGE DISPOSITION: The patient is being transferred to a rehab facility for further reconditioning with the intention that he will hopefully eventually return home. He was discharged on a 2 gram sodium, 1800 calorie diet.   CODE STATUS: The patient is a full code.   ____________________________ Letta PateJohn B. Danne HarborWalker III, MD [email or web address] D: 07/08/2013 07:41:37 ET T: 07/08/2013 07:59:10 ET JOB#: [id number]  cc: Letta PateJohn B. Danne HarborWalker III, MD, <Dictator> Elmo PuttJOHN B WALKER III MD ELECTRONICALLY SIGNED 07/09/2013 18:47

## 2014-07-26 NOTE — Consult Note (Signed)
   Comments   Josh Borders, NP, and I met with pt, his son and daughter-in-law. Updated family on pt's current medical condition. They have spoken with CSW and are aware that pt cannot return to SNF for STR. They are all in agreement with hospice at home and hospice screen is pending. However, all are appropriately concerned with pt's ability to care for himself, even with assistance from hospice. Son and daughter-in-law would like for pt to come live with them in [place] but realize that pt could not make the trip. Son is in the [employer] and is even considering "compassionate relocation" to be closer to pt. There is unfortunately no good immediate solution.  spoke very frankly with pt about code status.His goal is to live until October when his grandchild is to be born. I explained that cardiac resuscitaion would certainly be futile in light of his end-stage cardiomyopathy. I further explained that intubation would likely result in vent dependence. Pt and son will discuss further.  follow up tomorrow.   Electronic Signatures: Cheray Pardi, Izora Gala (MD)  (Signed 29-Apr-15 20:59)  Authored: Palliative Care   Last Updated: 29-Apr-15 20:59 by Kannen Moxey, Izora Gala (MD)

## 2014-07-26 NOTE — H&P (Signed)
PATIENT NAME:  Timothy Calhoun, Timothy Calhoun MR#:  [id number] DATE OF BIRTH:  06/04/1950  DATE OF ADMISSION:  07/26/2013  PRIMARY PHYSICIAN:  Dr. Dan Humphreys  ER PHYSICIAN:  Dr. Ethelda Chick  CHIEF COMPLAINT:  Infected left foot.  HISTORY OF PRESENT ILLNESS: The patient is a 64 year old male patient with history of hypertension, diabetes, CKD stage III, sent in from [facility] facility because of left foot infection and pain. The patient was taken for outpatient CAT scan of the foot, and was sent here for further evaluation. The patient has been having this infection for a couple of days, started as a small bump on the dorsum of the left foot, and symptoms have gotten worse. The patient is having severe pain, and the area is swollen, red, and having purulent drainage from the dorsum of the left foot. The patient was seen by Dr. Wyn Quaker a week ago, and had Unna boots for both legs. Those boots were taken off 2 days ago because of infection. The patient is having a lot of pain in the left foot because of infection. The patient received a dose of morphine 4 mg IV, and he says it helped him a lot. Right now he does not have any pain. The patient had some chills last night.   PAST MEDICAL HISTORY: Significant for history of discharge on April 5 to [facility] for physical therapy. He was here March 28 to April 6. The patient has history of diabetes mellitus type 2, ischemic cardiomyopathy due to coronary artery disease, status post CABG, history of essential hypertension, and history of recently-diagnosed left lung mass. The patient also has severe systolic heart failure, with EF of less than 20%. The patient has significant edema of the legs, and had also cardiorenal syndrome when he was here last time.   OUTPATIENT MEDICATIONS: (Dictation Anomaly) mg p.o. daily, Zetia 10 mg daily, Advair Diskus 100/50, 1 puff b.i.d., Mucinex 600 mg p.o. daily, Tylenol 650 mg p.o. q. 4 hours p.r.n., nitroglycerin 0.4 mg as needed for chest pain,  Coreg 3.125 mg p.o. b.i.d., Aldactone 25 mg p.o. daily, Protonix 40 mg p.o. daily, Levemir 70 units subcu at bedtime, Lasix 40 mg p.o. b.i.d., Lovenox - right now he is on 70 units at bedtime. The patient was also started on Eliquis recently, and he is on Eliquis 2.5 mg p.o. b.i.d. The patient is also on metolazone 5 mg p.o. every other day.   SOCIAL HISTORY: Significant for tobacco abuse. No alcohol.    FAMILY HISTORY: Significant for coronary artery disease and hypertension.  ALLERGIES: STATINS and TB SKIN TESTING.  REVIEW OF SYSTEMS:  CONSTITUTIONAL:  He denies any fatigue or weakness. He says that he is progressing well with physical therapy, and he has started to walk.  EYES: No blurred vision or double vision.  ENT: No tinnitus. No epistaxis. No difficulty swallowing.  RESPIRATIONS: There is no cough or trouble breathing.  CARDIOVASCULAR: No chest pain. No orthopnea. Does have pedal edema.  GASTROINTESTINAL: No nausea. No vomiting. No abdominal pain.  ENDOCRINE: No polyuria or nocturia.  HEMATOLOGIC: No anemia or easy bruising.  SKIN: Dorsum of the left foot infected, with drainage.   MUSCULOSKELETAL: No joint pain.  NEUROLOGIC: No history of TIA or CVA.  PSYCHIATRIC: No anxiety or insomnia.   PHYSICAL EXAMINATION:  VITAL SIGNS: Temperature 97.4, heart rate 70, blood pressure 109/63, sats 96% on room air. GENERAL: Alert, awake, oriented 64 year old male patient not in acute distress.  HEAD: Atraumatic, normocephalic.  EYES: Pupils equal, reacting  to light. Extraocular movements intact.  ENT: No tympanic membrane congestion. No turbinate hypertrophy. no oropharyngeal erythema\ N ECK: Supple. No JVD. No carotid bruit.  CARDIOVASCULAR: S1, S2 regular. No murmurs.  LUNGS: The patient has bilateral expiratory wheezes in all lung fields.  GASTROINTESTINAL: Abdomen is soft, obese. Bowel sounds within normal. No organomegaly. No hernias.  GENITOURINARY:  Deferred.  MUSCULOSKELETAL: The  patient has 4+ pitting edema up to the knees, both legs.   EXTREMITIES: The patient has dorsum of the left foot infected, with redness and tenderness to palpation, and also has purulent drainage from the dorsum of the left foot.   NEUROLOGIC: Alert, awake, oriented. Cranial nerves II to XII intact. Power 5/5 upper and lower extremities. Sensory intact. DTR 2+ bilaterally.   LAB DATA: Electrolytes: Sodium is 133, potassium 3.3, chloride 88, bicarb 40, BUN is 48, creatinine 1.60, glucose 118. WBC is 7.3, hemoglobin 13.4, hematocrit 40.9, platelets 176. CT of the lower leg with contrast shows extensive subcutaneous edema in the lower leg without abscess. The patient has subcutaneous edema in the foot tracking along the dorsum of the feet, without abscess or osteomyelitis. The patient's CT foot on the left side with contrast shows extensive subcutaneous edema of the lower legs without abscess. The patient does not have significant muscle involvement. The patient also has subcutaneous edema in the foot, particularly tracking on the dorsum of the foot.  EKG was not done in the ER.  ASSESSMENT AND PLAN:  141. A 64 year old male with cellulitis of the dorsum of the left foot, with purulent drainage. Admit the patient to hospitalist service. Start him on Vanco and Zosyn, and obtain wound cultures and start wound care. Obtain ID consult with Dr. Sampson GoonFitzgerald at this time. Follow the wound cultures and monitor him closely.   2. The patient has diabetes mellitus type 2. Continue him on sliding scale along with his Levemir.   3.  Chronic kidney disease stage 3. His renal function is stable, but he got contrast for his CAT scan, so we need to watch the renal function closely.   4. Chronic systolic heart failure, with ejection fraction of 15%. The patient is on Lasix and metolazone. Continue them and watch kidney function closely. Watch his breathing pattern closely. The patient already is on Eliquis, continue that.    5.  History of coronary artery disease, status post coronary artery bypass graft. He is on Zetia, metoprolol, and Coreg. Continue them. The patient also is on metolazone, continue that.   6.  Chronic obstructive pulmonary disease. The patient is still wheezing. Continue the patient on nebulizers.  7. Chronic respiratory failure. The patient is on oxygen 4 liters. Continue that, and keep sats more than 95%.   CODE STATUS: FULL CODE.   The patient is at high risk for cardiac arrest because of his systolic dysfunction.   Time spent on history and physical:  60 minutes.   ____________________________ Katha HammingSnehalatha Shenetta Schnackenberg, MD sk:mr D: 07/26/2013 20:29:00 ET T: 07/26/2013 21:16:01 ET JOB#: [id number]  cc: Katha HammingSnehalatha Marae Cottrell, MD, <Dictator> John B. Danne HarborWalker III, MD  Katha HammingSNEHALATHA Gita Dilger MD ELECTRONICALLY SIGNED 08/29/2013 12:13

## 2014-07-26 NOTE — Consult Note (Signed)
Admit Diagnosis:   LEFT FOOT CELLULITIS: Onset Date: 27-Jul-2013, Status: Active, Description: LEFT FOOT CELLULITIS    COPD:    Hypercholesterolemia:    Hypertension:    Atrial Fibrillation:    Diabetes:    CHF:    CAD:    cardiac bypass: at [facility], 2006  Home Medications: Medication Instructions Status  cephalexin 500 mg oral capsule 1 cap(s) orally every 12 hours x 10 days.  *start date 07/26/13* Active  fluticasone-salmeterol 100 mcg-50 mcg inhalation powder 1 puff(s) inhaled every 12 hours. *rinse mouth after use* Active  carvedilol 3.125 mg oral tablet 1 tab(s) orally 2 times a day Active  fenofibrate 145 mg oral tablet 1 tab(s) orally once a day Active  insulin detemir 100 units/mL subcutaneous solution 70 unit(s) subcutaneous once a day Active  guaiFENesin 600 mg oral tablet, extended release 1 tab(s) orally 2 times a day Active  niacin 500 mg oral tablet, extended release 2 tabs (1074m) orally once a day (at bedtime) Active  pantoprazole 40 mg oral delayed release tablet 1 tab(s) orally once a day Active  potassium chloride 10 mEq oral capsule, extended release 1 cap(s) orally 2 times a day Active  spironolactone 25 mg oral tablet 1 tab(s) orally once a day with meal. Active  ezetimibe 10 mg oral tablet 1 tab(s) orally once a day (at bedtime) Active  metolazone 5 mg oral tablet 1 tab(s) orally every other day Active  torsemide 10 mg oral tablet 1 tab(s) orally 2 times a day Active  apixaban 2.5 mg oral tablet 1 tab(s) orally 2 times a day Active  nitroglycerin 0.4 mg sublingual tablet 1 tab(s) sublingual every 5 minutes up to 3 doses as needed for chest pain Active  insulin aspart 100 units/mL subcutaneous solution use as directed subcutaneous 4 times a day (before meals and at bedtime) sliding scale:fs 61-80=4 oz of juice, 81-175=0u, 176-250=3u, 251-325=6 u, 326-450=10u, greater than 450=12 u, greater than 451 on 2 consecutive occasions call MD Active  sodium  chloride nasal 0.65% nasal spray 1 to 2 spray(s) in each nostril every 4 hours as needed for discomfort/nasal congestion Active  acetaminophen 325 mg oral tablet 2 tabs (6539m orally every 4 hours as needed for pain or temperature greater than 100.4 Active  diaper dermatitis cream Apply liberal amounts topically to  area of skin irritation as needed. Active  petrolatum topical 100% topical ointment Apply topically a thin film inside each nostril as needed for nose bleed. Active  acetaminophen-HYDROcodone 325 mg-5 mg oral tablet 1 to 2 tab(s) orally every 4 hours as needed for pain or shortness of breath  Active  Glucagon Emergency Kit for Low Blood Sugar recombinant 1 mg injectable powder for injection use as directed injectable 4 times a day (before meals and at bedtime) as needed for fsbs less than 60 Active  pro-stat sugar free 15-100g-kcal/3052mackets 1 packet(s) orally 2 times a day Active  Silvadene 1% topical cream Apply topically to blister on top of left foot and cover with 4x4s and kerlix 2 times a day Active   Lab Results: Routine Hem:  25-Apr-15 03:39   WBC (CBC) 7.2   Radiology Results:  Radiology Results: CT:    24-Apr-15 14:12, CT Foot Left With Contrast  CT Foot Left With Contrast  REASON FOR EXAM:    CALL REPORT [id number] Severe Lt Foot pain over   night, red, swollen  COMMENTS:       PROCEDURE: CT  - CT FOOT LEFT  WITH CONTRAST  - Jul 26 2013  2:12PM     CLINICAL DATA:  Left leg and foot swelling.  Erythema.  Lung cancer    EXAM:  CT OF THE LEFT FOOT WITH CONTRAST; CT OF THE LEFT TIBIA AND FIBULA  WITH CONTRAST    TECHNIQUE:  Multidetector CT imaging was performed following the standard  protocol during bolus administration of intravenous contrast.  CONTRAST:  75 cc Isovue 370    COMPARISON:  None    FINDINGS:  LOWER LEG CT:    Diffuse subcutaneous edema in the lower leg, more confluent  anteriorly. This is also more confluent in the vicinity of  the  ankle, overlying the malleoli. No subcutaneous abscess is observed.  No significant knee effusion.    Preserved fat planes between they muscular components the anterior  and posterior compartments. Cannot completely exclude mild tibialis  anterior edema, although I favor that there is no significant  muscular edema. No muscular abscess or findings of deep fasciitis.    Distal tibial and fibular spurring noted.    FOOT CT:    Extensive subcutaneous edema along the dorsum of the foot. Edema  tracks in the subcutaneous tissues of the medial and lateral ankle.  No abscess observed. No bony destructive findings characteristic of  osteomyelitis. No malalignment at the lisfranc joint.    Plantar and achilles calcaneal spurs noted.  No fracture identified.     IMPRESSION:  1. Extensive subcutaneous edema in the lower leg, without abscess,  significant muscular involvement, or acute bony lesion.  2. Prominent subcutaneous edema in the foot, particularly tracking  along the dorsum of the foot, without abscess, specific findings of  fasciitis, or osteomyelitis observed.      Electronically Signed    By: Sherryl Barters M.D.    On: 07/26/2013 14:20         Verified By: Carron Curie, M.D.,    Tuberculin Skin Test: Rash  Statins: Other  Nursing Flowsheets: **Vital Signs.:   25-Apr-15 09:07  Temperature Temperature (F) 96.9    General Aspect Pt admitted with cellulitis to left foot with large purulent bulla.   Present Illness Pt had been receiving unna boots to b/l lower legs.  Noted to have severe foot pain, celllulitis with bulla on dorsal left foot. CT was negative for deep infection and referred to [facility] for futher w/u.   Case History and Physical Exam:  Cardiovascular Non palpable pulses I believe 2ndary to edema as feet are warm with brisk cft.   Musculoskeletal Diffuse b/l edema   Neurological Grossly WNL   Skin Large drained bullous with purulence noted.   Retained skin stil with some purulent remnants.  I removed this skin and the underlying skin is stable, irritated but not necrotic.  No deep tracking areas and cellulitis appear to be surrounding the bullous.    Impression Superficial infected blister with celllulitis   Plan Removed infected superficial skin. Will start silvadene cream and non-adherent dressing. Will need continued local wound care with this. Can see pt in 1-2 weeks outpt upon d/c.   Electronic Signatures: Samara Deist (MD)  (Signed 25-Apr-15 12:06)  Authored: Health Issues, Significant Events - History, Home Medications, Labs, Radiology Results, Allergies, Vital Signs, General Aspect/Present Illness, History and Physical Exam, Impression/Plan   Last Updated: 25-Apr-15 12:06 by Samara Deist (MD)

## 2014-07-26 NOTE — Consult Note (Signed)
PATIENT NAME:  Timothy Calhoun, Timothy Calhoun MR#:  [id number] DATE OF BIRTH:  [birth year]  DATE OF CONSULTATION:  07/29/2013  REFERRING PHYSICIAN:  Yates DecampJohn Walker, MD CONSULTING PHYSICIAN:  Sheppard Plumberimothy E. Kimberlea Schlag, MD  REASON FOR REQUEST:  Left lung mass.   HISTORY: I have personally seen and examined Timothy Calhoun. I have seen the patient in the past and have independently reviewed his prior x-rays, including his chest CT and PET scan.   Timothy Calhoun is a 64 year old gentleman who has a multitude of medical problems who is currently admitted to the hospital with a cellulitis on his left foot. I initially had seen the patient several months ago for evaluation of a left lung mass. At the time, I felt that he was surgically unresectable secondary to mediastinal adenopathy, but I also felt the patient was medically inoperable because of his multiple other comorbid conditions. The patient had an attempt at a bronchoscopy but could not tolerate that and ultimately spent a considerable period of time in the hospital with management of his congestive heart failure. He was ultimately discharged to a rehab facility where he has been residing for the last several weeks trying to regain his strength. He has had continued issues with lower extremity edema, hypoxia requiring oxygen therapy and significant debility. The patient recently was admitted to the hospital for management of his cellulitis. He was seen by Dr. Festus BarrenJason Dew over the last several weeks and Unna boots had been applied bilaterally. However, he developed an ulcer on his foot and this is currently being managed by vascular surgery and podiatry.   PHYSICAL EXAMINATION:  GENERAL:  He is an elderly-appearing gentleman who appears much older than his stated age. He is able to speak in complete sentences.  LUNGS: Showed diminished breath sounds on both sides. There is poor inspiratory effort.  HEART: Irregularly irregular, consistent with diagnosis of atrial  fibrillation. LOWER EXTREMITIES:  Revealed significant lower extremity edema. He had chronic venous stasis changes bilaterally. His left foot was currently bandaged and I did not remove that.   ASSESSMENT AND PLAN: Timothy Calhoun has presumed lung cancer. An attempt at bronchoscopy was not successful. Therefore, I would attempt a percutaneous biopsy. Depending upon the results of that biopsy, we can then make further recommendations. I do not believe that he is a surgical candidate.   Thank you very much for allowing me to participate in his care.   ____________________________ Sheppard Plumberimothy E. Thelma Bargeaks, MD teo:ce D: 07/29/2013 16:54:32 ET T: 07/29/2013 18:29:57 ET JOB#: [id number]  cc: Marcial Pacasimothy E. Thelma Bargeaks, MD, <Dictator> Jasmine DecemberIMOTHY E Jaley Yan MD ELECTRONICALLY SIGNED 08/02/2013 15:59

## 2014-07-26 NOTE — H&P (Signed)
PATIENT NAME:  Timothy Calhoun, Timothy Calhoun MR#:  [id number] DATE OF BIRTH:  02-21-1951  DATE OF ADMISSION:  06/29/2013  REFERRING PHYSICIAN: Loleta Roseory Forbach, MD  PRIMARY CARE PHYSICIAN[facility]ampJohn Walker[facility]   CHIEF COMPLAINT: Shortness of breath.  HISTORY OF PRESENT ILLNESS: A 64 year old Caucasian gentleman with a past medical history of coronary artery status post CABG, diabetes, hypertension, who presented with shortness of breath that describes a 2-week duration of worsening shortness of breath, mainly dyspnea on exertion. He now only able to walk less than 20 feet without stopping for shortness of breath despite being on chronic O2. He also is now positive for orthopnea, needing to sleep upright in a chair, worsening lower extremity edema, PND. He denies any cough, fevers, chills, or chest pain. On arrival to the emergency department he is noted to be saturating only in the 70s despite 3 liters nasal cannula promptly placed on BiPAP therapy, given doses of Lasix. He is now somewhat improved. Still complaining of shortness of breath. Denies any chest pain.   REVIEW OF SYSTEMS:  CONSTITUTIONAL: Positive for fatigue, weakness. Denies fevers, chills.  EYES: Denies blurry, double vision, or eye pain.  HEENT: Denies tinnitus, ear pain, hearing loss.  RESPIRATORY: Denies cough, positive for shortness of breath, dyspnea on exertion.  CARDIOVASCULAR: Denies chest pain, positive for orthopnea, edema, PND.  GASTROINTESTINAL: Denies nausea, vomiting, diarrhea, abdominal pain.  GENITOURINARY: Denies dysuria or hematuria.  ENDOCRINE: Denies nocturia or thyroid problems.  HEMATOLOGIC AND LYMPHATIC: Denies easy bruising, bleeding. SKIN: Denies rash or lesions. MUSCULOSKELETAL: Denies pain in the neck, back, shoulders, knees or hips or arthritic symptoms.   NEUROLOGIC: Denies paralysis or paresthesias.  PSYCHIATRIC: Denies anxiety or depressive symptoms.   Otherwise, full review of systems performed  by me is negative.   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   SOCIAL HISTORY: Positive for tobacco as well as alcohol usage.   FAMILY HISTORY: Positive for coronary artery disease, as well as hypertension.   ALLERGIES: STATINS AND TUBERCULIN SKIN TESTING.   HOME MEDICATIONS: Aspirin 325 mg p.o. daily, Avapro 300 mg p.o. daily, Cardizem 240 mg extended release daily, Lantus 100 units subcutaneous at bedtime, metformin 1000 mg p.o. b.i.d., Victoza 18 mcg subcutaneous daily, Claritin 10 mg p.o. daily, and Niaspan extended release 1000 mg p.o. q.h.s., TriCor 145 mg p.o. daily, Zetia 10 mg p.o. q.h.s., metoprolol 50 mg p.o. b.i.d., Advair 100/50 mcg 1 puff b.i.d., Incruse Ellipta 62.5 mcg inhalation daily, ProAir 90 mcg inhalation 2 puffs every 4 to 6 hours as needed for shortness of breath. Lasix 40 mg p.o. b.i.d., potassium 10 mEq p.o. b.i.d., fish oil 1000 mg p.o. daily, multivitamins 1 tablet daily, vitamin B12 1000 mcg p.o. daily.   PHYSICAL EXAMINATION:  VITAL SIGNS: Temperature 96, heart rate 72, respirations 29, blood pressure 99/62, saturating 90% on BiPAP therapy. Weight 149.2 kg, BMI 47.2.  GENERAL: A critically ill-appearing Caucasian gentleman given respiratory status requiring BiPAP therapy.  HEAD: Normocephalic, atraumatic.  EYES: Pupils equal, round, reactive to light. Extraocular muscles intact. No scleral icterus.  MOUTH: Moist mucous membranes.  Dentition intact. No abscess noted.  EARS, NOSE, AND THROAT: Clear without exudates. No external lesions.  NECK: Supple. No thyromegaly. No nodules. Unable to assess JVD given body habitus.  PULMONARY: Diminishes breath sounds throughout with coarse breath sounds throughout. Tachypneic  though no use of accessory muscles. Currently  on BiPAP therapy.  CHEST:  Nontender on palpation.  CARDIOVASCULAR: S1, S2, regular rate and rhythm. No murmurs, rubs, or gallops, 3+ edema to hips bilaterally. Pedal pulses 2+ bilaterally.  GASTROINTESTINAL: Soft, nontender, nondistended. No masses. Positive bowel sounds. No hepatosplenomegaly.  MUSCULOSKELETAL: No swelling, no clubbing, positive for edema as described above. Range of motion is full in all extremities. NEUROLOGIC: Cranial nerves II through XII intact. No gross neurologic deficits. Sensation intact. Reflexes intact.  SKIN: No ulcerations, lesions, no rashes. Positive for peripheral cyanosis on fingers. Skin warm and dry. Turgor intact.  PSYCHIATRIC: Mood and affect within normal limits. The patient alert and oriented x 3. Insight and judgment intact.   LABORATORY DATA: EKG performed. Sinus bradycardia, heart rate 67, sodium 130, potassium 4.5, chloride 97, bicarbonate 23, BUN 46, creatinine 2.79, glucose 131. BNP 5890. LFTs: Total protein 6, albumin 2.9, bilirubin 1.7, alkaline phosphatase 173, ALT 849. WBC 11.7, hemoglobin 15.2, platelets 248,000, INR of 2. ABG performed on BiPAP therapy 14/6, 7.41, CO2 of 29 oxygen 230, bicarbonate of 18.4 with a lactic acid of 2.6.   Chest x-ray performed: Marked cardiomegaly with changes of congestive heart failure as well as mention of left upper lobe mass, which was previously seen.   ASSESSMENT AND PLAN: A 64 year old gentleman with history of coronary artery disease status post CABG, and known left lung mass with workup ongoing presenting with shortness of breath.  1. Acute decompensated congestive heart failure with evidence of end organ damage given by lactic acid, acute kidney injury, and hypoxemia. We will follow urine output, renal function, and daily weights. Check a transthoracic echocardiogram. Cardiac enzymes x 3. Consult cardiology. He follows with Dr. Darrold JunkerParaschos. For treatment, will provide diuresis with Lasix. Will need to Foley  catheter insertion. Blood pressure is too low for peripheral vasodilators, nitroglycerin. Will hold his medication, provide oxygen and to maintain SO2 greater than 92%. Currently on BiPAP therapy. Provide DuoNeb therapy q. 4 hours: Hold ACE inhibitors given renal function and blood pressure. Will need to add an inotropic agent, dobutamine at 2.5 mcg/kg per minute in order to hopefully increase cardiac output and decrease systemic vascular resistance. If blood pressure remains a problem, will need pressor therapy with Levophed. 2. Acute hypoxemic respiratory failure. This is due to congestive heart failure. Continue BiPAP therapy, supplemental O2 and DuoNeb treatments as above.  3. Acute kidney injury, likely cardiorenal syndrome given poor cardiac output. If no improvement will need nephrology's input. Hopefully an increased cardiac output will improve renal function. We will continue with diuresis despite poor renal function at this time although we will have to carefully monitor this. 4. Transaminitis, secondary to hepatic congestion. Follow liver function tests.  5. Lactic acidosis. Increase cardiac output with dobutamine, diuresis as tolerated.  6. Diabetes. Hold p.o. agents. Continue his Lantus therapy. Add insulin sliding scale.  7. Venous thromboembolism prophylaxis with heparin subcutaneous.   CODE STATUS: The patient is full code.  CRITICAL CARE TIME SPENT: 65 minutes    ____________________________ Cletis Athensavid K. Hower, MD dkh:lt D: 06/29/2013 23:10:06 ET T: 06/30/2013 00:21:38 ET JOB#: [id number]  cc: Cletis Athensavid K. Hower, MD, <Dictator> DAVID Synetta ShadowK HOWER MD ELECTRONICALLY SIGNED 06/30/2013 2:59

## 2014-07-26 NOTE — Consult Note (Signed)
Brief Consult Note: Diagnosis: patient with history of cardiomyopathy admitted with cellulitis. Has some shortness of breath.   Patient was seen by consultant.   Recommend further assessment or treatment.   Comments: 64 year old male with historyof severe cardiomyopathy with ejection fraction less than 20%. He also has a history of coronary artery disease. He has sevheral edema with cellulitis. He had evidence of mild edema on admission. He is gradually improving. He has compression devices on his legs and is on antibiotics. Would continue to aggressively treat his device and lower extremity edema. We'll continue to treat his current myopathy with afterload reduction, beta blockers and careful use of diuretics.  Electronic Signatures: Dalia HeadingFath, Old[place] Klauer A (MD)  (Signed 26-Apr-15 18:35)  Authored: Brief Consult Note   Last Updated: 26-Apr-15 18:35 by Dalia HeadingFath, Shalina Norfolk A (MD)

## 2014-07-26 NOTE — H&P (Signed)
PATIENT NAME:  Timothy Calhoun, Timothy Calhoun MR#:  [id number] DATE OF BIRTH:  02-21-1951  DATE OF ADMISSION:  06/29/2013  REFERRING PHYSICIAN: Loleta Roseory Forbach, MD  PRIMARY CARE PHYSICIAN: Yates DecampJohn Walker, MD, [facility].   CHIEF COMPLAINT: Shortness of breath.  HISTORY OF PRESENT ILLNESS: A 64 year old Caucasian gentleman with a past medical history of coronary artery status post CABG, diabetes, hypertension, who presented with shortness of breath that describes a 2-week duration of worsening shortness of breath, mainly dyspnea on exertion. He now only able to walk less than 20 feet without stopping for shortness of breath despite being on chronic O2. He also is now positive for orthopnea, needing to sleep upright in a chair, worsening lower extremity edema, PND. He denies any cough, fevers, chills, or chest pain. On arrival to the emergency department he is noted to be saturating only in the 70s despite 3 liters nasal cannula promptly placed on BiPAP therapy, given doses of Lasix. He is now somewhat improved. Still complaining of shortness of breath. Denies any chest pain.   REVIEW OF SYSTEMS:  CONSTITUTIONAL: Positive for fatigue, weakness. Denies fevers, chills.  EYES: Denies blurry, double vision, or eye pain.  HEENT: Denies tinnitus, ear pain, hearing loss.  RESPIRATORY: Denies cough, positive for shortness of breath, dyspnea on exertion.  CARDIOVASCULAR: Denies chest pain, positive for orthopnea, edema, PND.  GASTROINTESTINAL: Denies nausea, vomiting, diarrhea, abdominal pain.  GENITOURINARY: Denies dysuria or hematuria.  ENDOCRINE: Denies nocturia or thyroid problems.  HEMATOLOGIC AND LYMPHATIC: Denies easy bruising, bleeding. SKIN: Denies rash or lesions. MUSCULOSKELETAL: Denies pain in the neck, back, shoulders, knees or hips or arthritic symptoms.   NEUROLOGIC: Denies paralysis or paresthesias.  PSYCHIATRIC: Denies anxiety or depressive symptoms.   Otherwise, full review of systems performed  by me is negative.   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   SOCIAL HISTORY: Positive for tobacco as well as alcohol usage.   FAMILY HISTORY: Positive for coronary artery disease, as well as hypertension.   ALLERGIES: STATINS AND TUBERCULIN SKIN TESTING.   HOME MEDICATIONS: Aspirin 325 mg p.o. daily, Avapro 300 mg p.o. daily, Cardizem 240 mg extended release daily, Lantus 100 units subcutaneous at bedtime, metformin 1000 mg p.o. b.i.d., Victoza 18 mcg subcutaneous daily, Claritin 10 mg p.o. daily, and Niaspan extended release 1000 mg p.o. q.h.s., TriCor 145 mg p.o. daily, Zetia 10 mg p.o. q.h.s., metoprolol 50 mg p.o. b.i.d., Advair 100/50 mcg 1 puff b.i.d., Incruse Ellipta 62.5 mcg inhalation daily, ProAir 90 mcg inhalation 2 puffs every 4 to 6 hours as needed for shortness of breath. Lasix 40 mg p.o. b.i.d., potassium 10 mEq p.o. b.i.d., fish oil 1000 mg p.o. daily, multivitamins 1 tablet daily, vitamin B12 1000 mcg p.o. daily.   PHYSICAL EXAMINATION:  VITAL SIGNS: Temperature 96, heart rate 72, respirations 29, blood pressure 99/62, saturating 90% on BiPAP therapy. Weight 149.2 kg, BMI 47.2.  GENERAL: A critically ill-appearing Caucasian gentleman given respiratory status requiring BiPAP therapy.  HEAD: Normocephalic, atraumatic.  EYES: Pupils equal, round, reactive to light. Extraocular muscles intact. No scleral icterus.  MOUTH: Moist mucous membranes.  Dentition intact. No abscess noted.  EARS, NOSE, AND THROAT: Clear without exudates. No external lesions.  NECK: Supple. No thyromegaly. No nodules. Unable to assess JVD given body habitus.  PULMONARY: Diminishes breath sounds throughout with coarse breath sounds throughout. Tachypneic  though no use of accessory muscles. Currently  on BiPAP therapy.  CHEST:  Nontender on palpation.  CARDIOVASCULAR: S1, S2, regular rate and rhythm. No murmurs, rubs, or gallops, 3+ edema to hips bilaterally. Pedal pulses 2+ bilaterally.  GASTROINTESTINAL: Soft, nontender, nondistended. No masses. Positive bowel sounds. No hepatosplenomegaly.  MUSCULOSKELETAL: No swelling, no clubbing, positive for edema as described above. Range of motion is full in all extremities. NEUROLOGIC: Cranial nerves II through XII intact. No gross neurologic deficits. Sensation intact. Reflexes intact.  SKIN: No ulcerations, lesions, no rashes. Positive for peripheral cyanosis on fingers. Skin warm and dry. Turgor intact.  PSYCHIATRIC: Mood and affect within normal limits. The patient alert and oriented x 3. Insight and judgment intact.   LABORATORY DATA: EKG performed. Sinus bradycardia, heart rate 67, sodium 130, potassium 4.5, chloride 97, bicarbonate 23, BUN 46, creatinine 2.79, glucose 131. BNP 5890. LFTs: Total protein 6, albumin 2.9, bilirubin 1.7, alkaline phosphatase 173, ALT 849. WBC 11.7, hemoglobin 15.2, platelets 248,000, INR of 2. ABG performed on BiPAP therapy 14/6, 7.41, CO2 of 29 oxygen 230, bicarbonate of 18.4 with a lactic acid of 2.6.   Chest x-ray performed: Marked cardiomegaly with changes of congestive heart failure as well as mention of left upper lobe mass, which was previously seen.   ASSESSMENT AND PLAN: A 64 year old gentleman with history of coronary artery disease status post CABG, and known left lung mass with workup ongoing presenting with shortness of breath.  1. Acute decompensated congestive heart failure with evidence of end organ damage given by lactic acid, acute kidney injury, and hypoxemia. We will follow urine output, renal function, and daily weights. Check a transthoracic echocardiogram. Cardiac enzymes x 3. Consult cardiology. He follows with Dr. Darrold Junker. For treatment, will provide diuresis with Lasix. Will need to Foley  catheter insertion. Blood pressure is too low for peripheral vasodilators, nitroglycerin. Will hold his medication, provide oxygen and to maintain SO2 greater than 92%. Currently on BiPAP therapy. Provide DuoNeb therapy q. 4 hours: Hold ACE inhibitors given renal function and blood pressure. Will need to add an inotropic agent, dobutamine at 2.5 mcg/kg per minute in order to hopefully increase cardiac output and decrease systemic vascular resistance. If blood pressure remains a problem, will need pressor therapy with Levophed. 2. Acute hypoxemic respiratory failure. This is due to congestive heart failure. Continue BiPAP therapy, supplemental O2 and DuoNeb treatments as above.  3. Acute kidney injury, likely cardiorenal syndrome given poor cardiac output. If no improvement will need nephrology's input. Hopefully an increased cardiac output will improve renal function. We will continue with diuresis despite poor renal function at this time although we will have to carefully monitor this. 4. Transaminitis, secondary to hepatic congestion. Follow liver function tests.  5. Lactic acidosis. Increase cardiac output with dobutamine, diuresis as tolerated.  6. Diabetes. Hold p.o. agents. Continue his Lantus therapy. Add insulin sliding scale.  7. Venous thromboembolism prophylaxis with heparin subcutaneous.   CODE STATUS: The patient is full code.  CRITICAL CARE TIME SPENT: 65 minutes   ____________________________ Cletis Athens. Anella Nakata, MD dkh:[id number] D: 06/29/2013 23:10:00 ET T: 06/30/2013 00:21:38 ET JOB#: [id number]  cc: Cletis Athens. Anessia Oakland, MD, <Dictator> Wilberta Dorvil Synetta Shadow MD ELECTRONICALLY SIGNED 07/10/2013 20:20

## 2014-07-26 NOTE — Consult Note (Signed)
morbidly obese patient with chronic severe swelling, worse with this admission.weeping from right foot and anklehas volume issues, chronic swelling a component of volume and lymphedema.do UNNA Boots now to get swelling downneed compression as outpatient and I will be happy to see in office too.also benefit from lymphedema pump as an outpatient  Electronic Signatures: Dew, Jason Annice NeedyS (MD)  (Signed on 05-Apr-15 10:15)  Authored  Last Updated: 05-Apr-15 10:15 by Annice Needyew, Jason S (MD)

## 2014-07-26 NOTE — Discharge Summary (Signed)
PATIENT NAME:  Timothy Calhoun, Tyr MR#:  [id number] DATE OF BIRTH:  1950-05-18  DATE OF ADMISSION:  07/26/2013 DATE OF DISCHARGE:  08/02/2013  HISTORY OF PRESENT ILLNESS: Mr. Noralyn PickCarroll is a 64 year old white gentleman with end-stage coronary artery disease, hypertension and type 2 diabetes, who was sent in from the skilled nursing facility because of infection of the left foot. CT scan of the foot as an outpatient showed no evidence of osteomyelitis. However, the cellulitis had started as a small bump on the dorsal left foot. It has become progressively larger over the last 24 to 48 hours. The patient was in severe pain and the area was red, hot and swollen. There was purulent drainage reportedly from the left foot. The patient had been seen by Dr. Wyn Quakerew a week prior to admission and boots had been placed on both legs. The boots had been removed 2 days prior to her admission because of infection.   PAST MEDICAL HISTORY, MEDICATIONS AND ALLERGIES: Well outlined in the dictated admission note by the admitting physician.   The patient's admission laboratory work showed a sodium of 133, a potassium of 3.3, chloride of 88, bicarbonate of 40, BUN of 48, creatinine of 1.2, glucose of 118. White count was 7300, hemoglobin 13.4, hematocrit of 40.9 and a platelet count of 176,000. CT scan of the left leg showed extensive subcutaneous edema, but no definite abscess or osteomyelitis.   HOSPITAL COURSE: The patient was admitted to the regular medical floor where he was started on IV vancomycin and Zosyn. He was seen in consultation by podiatry and infectious disease. The patient eventually was treated over a course of 7 days with IV antibiotics. The foot was debrided by podiatry. Blood cultures eventually showed no growth. The patient's hospital course was complicated by social issues. He only had 2 days left of coverage at skilled nursing. He lives alone and had no immediate family. His son flew in from [place] and in  consultation with palliative care, arrangements were made for the patient to be flown [facility] where he will be accepted by hospice service there.   DISCHARGE DIAGNOSIS: Cellulitis of the left foot.   DISCHARGE MEDICATIONS:  1.  Guaifenesin 600 mg extended-release b.i.d.  2.  Nitroglycerin 0.4 mg sublingual p.r.n.  3.  Sodium chloride nasal spray 0.65% 1 to 2 sprays each nostril every 4 hours as needed.  4.  Acetaminophen 325 mg 2 tablets every 4 hours as needed.  5.  Diaper dermatitis cream, apply liberally to affected area daily p.r.n.  6.  ProStat sugar-free 15-100 g/kg/30 mL packets, 1 packet b.i.d.  7.  Silvadene cream, apply to top of the left foot b.i.d.  8.  Aldactone 25 mg daily.  9.  Norco 5/325, 1 to 2 tablets every 4 hours as needed.  10. Apixaban 2.5 mg 1 tablet b.i.d.  11. Coreg 3.125 mg b.i.d.  12. Fluticasone-salmeterol 100/50, 1 puff every 12 hours.  13. Furosemide 10 mg twice a day.  14. Potassium chloride 10 mEq extended-release 1 capsule twice a day.  15. Protonix 40 mg daily.  16. Insulin Detemir 70 units subcutaneously once a day.  17. Insulin aspart as per sliding scale.   At the time of discharge, the patient had been taken off of his fenofibrate, niacin, Zetia, Zaroxolyn and Keflex.   DIET: The patient is being discharged on a low sodium, diabetic diet.   He is to be continued on oxygen at 3 to 4 L as necessary.   The  patient is being discharged to his family's care, who plan to take him tomorrow to [facility].  ____________________________ Letta Pate. Danne Harbor, MD [id number] D: 08/20/2013 08:07:47 ET T: 08/20/2013 08:25:01 ET JOB#: [id number]  cc: Letta Pate. Danne Harbor, MD, <Dictator> Elmo Putt III MD ELECTRONICALLY SIGNED 08/21/2013 13:01

## 2014-10-24 IMAGING — CR DG CHEST 1V PORT
1 series · 1 of 1 positions shown · non-contrast
Comparison: DG CHEST 1V PORT dated 06/30/2013; CT CHEST W/O CM dated
05/29/2013

CLINICAL DATA: CHF, respiratory failure

EXAM:
PORTABLE CHEST - 1 VIEW

[ap]
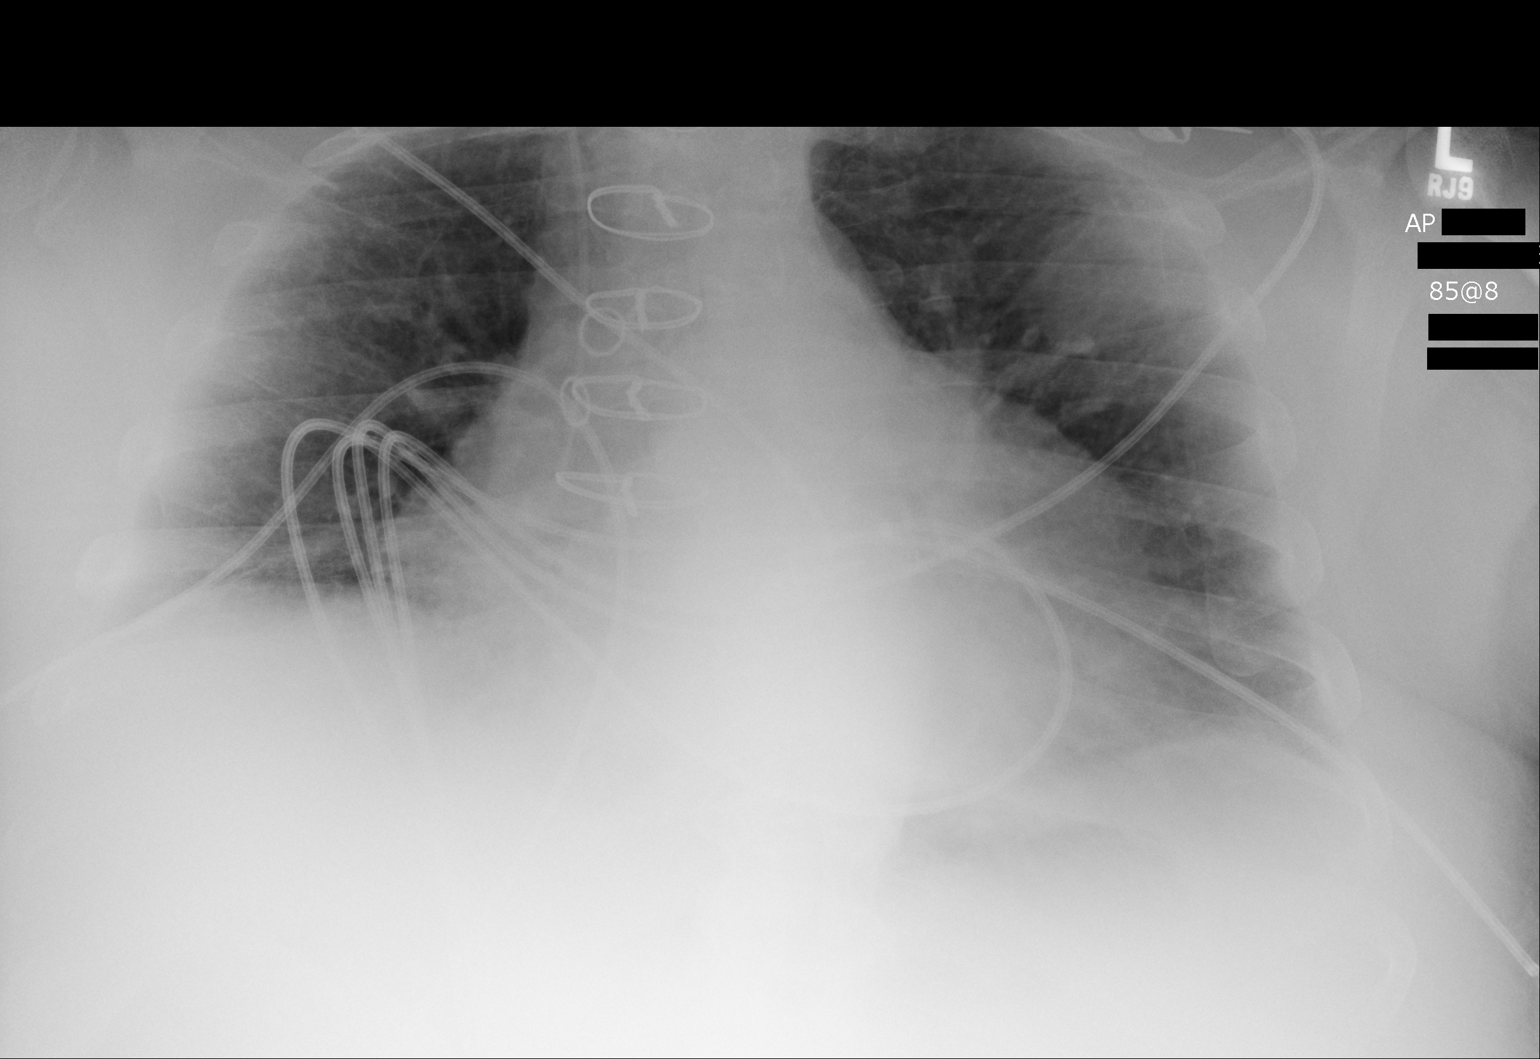

[1 of 1 positions shown; findings below may reference images not displayed]

FINDINGS: Low lung volumes. The cardiac silhouette is enlarged. A stable left
upper lobe peripheral mass is appreciated. No evidence of acute
infiltrates no further region of consolidation. Osseous structures
unremarkable. Patient is status post median sternotomy and coronary
artery bypass grafting. No pneumothorax appreciated. Right internal
jugular catheter stable. Decreased prominence of interstitial
markings.
IMPRESSION: Improving pulmonary edema with near complete resolution. Otherwise
stable chest radiograph.
